# Patient Record
Sex: Female | Born: 2000 | Race: Asian | Hispanic: No | Marital: Single | Smoking: Never smoker
Health system: Southern US, Community
[De-identification: ages and names within clinical notes are randomized; demographics above are authoritative.]

---
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
## 2020-03-14 DIAGNOSIS — Z23 Encounter for immunization: Secondary | ICD-10-CM | POA: Diagnosis not present

## 2020-05-17 DIAGNOSIS — Z419 Encounter for procedure for purposes other than remedying health state, unspecified: Secondary | ICD-10-CM | POA: Diagnosis not present

## 2020-06-17 DIAGNOSIS — Z419 Encounter for procedure for purposes other than remedying health state, unspecified: Secondary | ICD-10-CM | POA: Diagnosis not present

## 2020-07-18 DIAGNOSIS — Z419 Encounter for procedure for purposes other than remedying health state, unspecified: Secondary | ICD-10-CM | POA: Diagnosis not present

## 2020-08-17 DIAGNOSIS — Z419 Encounter for procedure for purposes other than remedying health state, unspecified: Secondary | ICD-10-CM | POA: Diagnosis not present

## 2020-09-17 DIAGNOSIS — Z419 Encounter for procedure for purposes other than remedying health state, unspecified: Secondary | ICD-10-CM | POA: Diagnosis not present

## 2020-10-17 DIAGNOSIS — Z419 Encounter for procedure for purposes other than remedying health state, unspecified: Secondary | ICD-10-CM | POA: Diagnosis not present

## 2020-11-11 DIAGNOSIS — J40 Bronchitis, not specified as acute or chronic: Secondary | ICD-10-CM | POA: Diagnosis not present

## 2020-11-17 DIAGNOSIS — Z419 Encounter for procedure for purposes other than remedying health state, unspecified: Secondary | ICD-10-CM | POA: Diagnosis not present

## 2020-12-18 DIAGNOSIS — Z419 Encounter for procedure for purposes other than remedying health state, unspecified: Secondary | ICD-10-CM | POA: Diagnosis not present

## 2021-01-15 DIAGNOSIS — Z419 Encounter for procedure for purposes other than remedying health state, unspecified: Secondary | ICD-10-CM | POA: Diagnosis not present

## 2021-01-25 DIAGNOSIS — Z23 Encounter for immunization: Secondary | ICD-10-CM | POA: Diagnosis not present

## 2021-01-25 DIAGNOSIS — Z0000 Encounter for general adult medical examination without abnormal findings: Secondary | ICD-10-CM | POA: Diagnosis not present

## 2021-01-25 DIAGNOSIS — Z8249 Family history of ischemic heart disease and other diseases of the circulatory system: Secondary | ICD-10-CM | POA: Diagnosis not present

## 2021-01-25 DIAGNOSIS — H5213 Myopia, bilateral: Secondary | ICD-10-CM | POA: Diagnosis not present

## 2021-01-25 DIAGNOSIS — H52203 Unspecified astigmatism, bilateral: Secondary | ICD-10-CM | POA: Diagnosis not present

## 2021-01-25 DIAGNOSIS — Z1329 Encounter for screening for other suspected endocrine disorder: Secondary | ICD-10-CM | POA: Diagnosis not present

## 2021-01-25 DIAGNOSIS — G4700 Insomnia, unspecified: Secondary | ICD-10-CM | POA: Diagnosis not present

## 2021-01-25 DIAGNOSIS — Z0001 Encounter for general adult medical examination with abnormal findings: Secondary | ICD-10-CM | POA: Diagnosis not present

## 2021-02-15 DIAGNOSIS — Z419 Encounter for procedure for purposes other than remedying health state, unspecified: Secondary | ICD-10-CM | POA: Diagnosis not present

## 2021-03-17 DIAGNOSIS — Z419 Encounter for procedure for purposes other than remedying health state, unspecified: Secondary | ICD-10-CM | POA: Diagnosis not present

## 2021-04-17 DIAGNOSIS — Z419 Encounter for procedure for purposes other than remedying health state, unspecified: Secondary | ICD-10-CM | POA: Diagnosis not present

## 2021-05-17 DIAGNOSIS — Z419 Encounter for procedure for purposes other than remedying health state, unspecified: Secondary | ICD-10-CM | POA: Diagnosis not present

## 2021-06-17 DIAGNOSIS — Z419 Encounter for procedure for purposes other than remedying health state, unspecified: Secondary | ICD-10-CM | POA: Diagnosis not present

## 2021-07-18 DIAGNOSIS — Z419 Encounter for procedure for purposes other than remedying health state, unspecified: Secondary | ICD-10-CM | POA: Diagnosis not present

## 2021-08-17 DIAGNOSIS — Z419 Encounter for procedure for purposes other than remedying health state, unspecified: Secondary | ICD-10-CM | POA: Diagnosis not present

## 2021-09-17 DIAGNOSIS — Z419 Encounter for procedure for purposes other than remedying health state, unspecified: Secondary | ICD-10-CM | POA: Diagnosis not present

## 2021-10-17 DIAGNOSIS — Z419 Encounter for procedure for purposes other than remedying health state, unspecified: Secondary | ICD-10-CM | POA: Diagnosis not present

## 2021-11-17 DIAGNOSIS — Z419 Encounter for procedure for purposes other than remedying health state, unspecified: Secondary | ICD-10-CM | POA: Diagnosis not present

## 2021-12-18 DIAGNOSIS — Z419 Encounter for procedure for purposes other than remedying health state, unspecified: Secondary | ICD-10-CM | POA: Diagnosis not present

## 2022-01-15 DIAGNOSIS — Z419 Encounter for procedure for purposes other than remedying health state, unspecified: Secondary | ICD-10-CM | POA: Diagnosis not present

## 2022-01-27 DIAGNOSIS — Z1159 Encounter for screening for other viral diseases: Secondary | ICD-10-CM | POA: Diagnosis not present

## 2022-01-27 DIAGNOSIS — Z0001 Encounter for general adult medical examination with abnormal findings: Secondary | ICD-10-CM | POA: Diagnosis not present

## 2022-01-27 DIAGNOSIS — Z0000 Encounter for general adult medical examination without abnormal findings: Secondary | ICD-10-CM | POA: Diagnosis not present

## 2022-01-27 DIAGNOSIS — R221 Localized swelling, mass and lump, neck: Secondary | ICD-10-CM | POA: Diagnosis not present

## 2022-02-15 DIAGNOSIS — Z419 Encounter for procedure for purposes other than remedying health state, unspecified: Secondary | ICD-10-CM | POA: Diagnosis not present

## 2022-03-17 DIAGNOSIS — Z419 Encounter for procedure for purposes other than remedying health state, unspecified: Secondary | ICD-10-CM | POA: Diagnosis not present

## 2022-04-17 DIAGNOSIS — Z419 Encounter for procedure for purposes other than remedying health state, unspecified: Secondary | ICD-10-CM | POA: Diagnosis not present

## 2022-04-24 DIAGNOSIS — B3749 Other urogenital candidiasis: Secondary | ICD-10-CM | POA: Diagnosis not present

## 2022-04-24 DIAGNOSIS — Z01419 Encounter for gynecological examination (general) (routine) without abnormal findings: Secondary | ICD-10-CM | POA: Diagnosis not present

## 2022-04-24 DIAGNOSIS — Z01411 Encounter for gynecological examination (general) (routine) with abnormal findings: Secondary | ICD-10-CM | POA: Diagnosis not present

## 2022-04-24 DIAGNOSIS — B3731 Acute candidiasis of vulva and vagina: Secondary | ICD-10-CM | POA: Diagnosis not present

## 2022-05-17 DIAGNOSIS — Z419 Encounter for procedure for purposes other than remedying health state, unspecified: Secondary | ICD-10-CM | POA: Diagnosis not present

## 2022-06-17 DIAGNOSIS — Z419 Encounter for procedure for purposes other than remedying health state, unspecified: Secondary | ICD-10-CM | POA: Diagnosis not present

## 2022-07-18 DIAGNOSIS — Z419 Encounter for procedure for purposes other than remedying health state, unspecified: Secondary | ICD-10-CM | POA: Diagnosis not present

## 2022-08-17 DIAGNOSIS — Z419 Encounter for procedure for purposes other than remedying health state, unspecified: Secondary | ICD-10-CM | POA: Diagnosis not present

## 2022-09-17 DIAGNOSIS — Z419 Encounter for procedure for purposes other than remedying health state, unspecified: Secondary | ICD-10-CM | POA: Diagnosis not present

## 2022-10-17 DIAGNOSIS — Z419 Encounter for procedure for purposes other than remedying health state, unspecified: Secondary | ICD-10-CM | POA: Diagnosis not present

## 2022-11-17 DIAGNOSIS — Z419 Encounter for procedure for purposes other than remedying health state, unspecified: Secondary | ICD-10-CM | POA: Diagnosis not present

## 2022-12-18 DIAGNOSIS — Z419 Encounter for procedure for purposes other than remedying health state, unspecified: Secondary | ICD-10-CM | POA: Diagnosis not present

## 2023-01-16 DIAGNOSIS — Z419 Encounter for procedure for purposes other than remedying health state, unspecified: Secondary | ICD-10-CM | POA: Diagnosis not present

## 2023-02-16 DIAGNOSIS — Z419 Encounter for procedure for purposes other than remedying health state, unspecified: Secondary | ICD-10-CM | POA: Diagnosis not present

## 2023-03-09 DIAGNOSIS — R221 Localized swelling, mass and lump, neck: Secondary | ICD-10-CM | POA: Diagnosis not present

## 2023-03-18 DIAGNOSIS — Z419 Encounter for procedure for purposes other than remedying health state, unspecified: Secondary | ICD-10-CM | POA: Diagnosis not present

## 2023-04-08 DIAGNOSIS — R221 Localized swelling, mass and lump, neck: Secondary | ICD-10-CM | POA: Diagnosis not present

## 2023-04-18 DIAGNOSIS — Z419 Encounter for procedure for purposes other than remedying health state, unspecified: Secondary | ICD-10-CM | POA: Diagnosis not present

## 2023-05-18 DIAGNOSIS — Z419 Encounter for procedure for purposes other than remedying health state, unspecified: Secondary | ICD-10-CM | POA: Diagnosis not present

## 2023-06-18 DIAGNOSIS — Z419 Encounter for procedure for purposes other than remedying health state, unspecified: Secondary | ICD-10-CM | POA: Diagnosis not present
